# Patient Record
Sex: Male | Born: 1990 | Race: Black or African American | Hispanic: No | Marital: Single | Smoking: Current every day smoker
Health system: Southern US, Community
[De-identification: ages and names within clinical notes are randomized; demographics above are authoritative.]

## PROBLEM LIST (undated history)

## (undated) DIAGNOSIS — I10 Essential (primary) hypertension: Secondary | ICD-10-CM

---

## 2015-07-29 ENCOUNTER — Emergency Department (HOSPITAL_BASED_OUTPATIENT_CLINIC_OR_DEPARTMENT_OTHER)
Admission: EM | Admit: 2015-07-29 | Discharge: 2015-07-29 | Disposition: A | Discharge status: Home / Self Care | Payer: Self-pay | Attending: Emergency Medicine | Admitting: Emergency Medicine

## 2015-07-29 ENCOUNTER — Encounter (HOSPITAL_BASED_OUTPATIENT_CLINIC_OR_DEPARTMENT_OTHER): Payer: Self-pay

## 2015-07-29 DIAGNOSIS — J069 Acute upper respiratory infection, unspecified: Secondary | ICD-10-CM | POA: Insufficient documentation

## 2015-07-29 DIAGNOSIS — F17200 Nicotine dependence, unspecified, uncomplicated: Secondary | ICD-10-CM | POA: Insufficient documentation

## 2015-07-29 DIAGNOSIS — I10 Essential (primary) hypertension: Secondary | ICD-10-CM | POA: Insufficient documentation

## 2015-07-29 HISTORY — DX: Essential (primary) hypertension: I10

## 2015-07-29 NOTE — ED Notes (Signed)
Pt not in lobby.  

## 2015-07-29 NOTE — ED Notes (Signed)
Pt not in waiting room

## 2015-07-29 NOTE — ED Notes (Signed)
Pt c/o headache, nasal congestion and cough for the last two days after moving in the rain, unrelieved by one dose of zyrtec

## 2017-03-09 ENCOUNTER — Emergency Department (HOSPITAL_COMMUNITY)
Admission: EM | Admit: 2017-03-09 | Discharge: 2017-03-09 | Disposition: A | Discharge status: Home / Self Care | Payer: Self-pay | Attending: Emergency Medicine | Admitting: Emergency Medicine

## 2017-03-09 ENCOUNTER — Encounter (HOSPITAL_COMMUNITY): Payer: Self-pay | Admitting: *Deleted

## 2017-03-09 ENCOUNTER — Other Ambulatory Visit: Payer: Self-pay

## 2017-03-09 ENCOUNTER — Emergency Department (HOSPITAL_COMMUNITY): Payer: Self-pay

## 2017-03-09 DIAGNOSIS — W208XXA Other cause of strike by thrown, projected or falling object, initial encounter: Secondary | ICD-10-CM | POA: Insufficient documentation

## 2017-03-09 DIAGNOSIS — M79671 Pain in right foot: Secondary | ICD-10-CM | POA: Insufficient documentation

## 2017-03-09 DIAGNOSIS — Y939 Activity, unspecified: Secondary | ICD-10-CM | POA: Insufficient documentation

## 2017-03-09 DIAGNOSIS — Y990 Civilian activity done for income or pay: Secondary | ICD-10-CM | POA: Insufficient documentation

## 2017-03-09 DIAGNOSIS — F17200 Nicotine dependence, unspecified, uncomplicated: Secondary | ICD-10-CM | POA: Insufficient documentation

## 2017-03-09 DIAGNOSIS — I10 Essential (primary) hypertension: Secondary | ICD-10-CM | POA: Insufficient documentation

## 2017-03-09 DIAGNOSIS — Y929 Unspecified place or not applicable: Secondary | ICD-10-CM | POA: Insufficient documentation

## 2017-03-09 MED ORDER — IBUPROFEN 400 MG PO TABS: 400.0000 mg | ORAL_TABLET | Freq: Three times a day (TID) | ORAL | 0 refills | Status: AC

## 2017-03-09 MED ORDER — IBUPROFEN 400 MG PO TABS
600.0000 mg | ORAL_TABLET | Freq: Once | ORAL | Status: AC
  Administered 2017-03-09: 600 mg via ORAL
  Filled 2017-03-09: qty 2

## 2017-03-09 NOTE — ED Triage Notes (Signed)
Pt was at work when furniture fell on right foot,

## 2017-03-09 NOTE — ED Provider Notes (Signed)
[name] EMERGENCY DEPARTMENT Provider Note   CSN: [id number] Arrival date & time: 03/09/17  1939     History   Chief Complaint Chief Complaint  Patient presents with  . Foot Pain    HPI Robert Mendez is a 26 y.o. male.  HPI Patient presents with concern of right foot and ankle pain.  Patient was at work earlier in the day, when a piece of furniture fell onto his leg, and he was trapped beneath it for several minutes. No other substantial injuries, no other pain or complaints elsewhere. He states that he is generally well, and was in his usual state of health prior to this. Initially there was some discomfort, but this increased over the course of the day, not substantially improved with Tylenol.  No distal loss of sensation, no strength.  Past Medical History:  Diagnosis Date  . Hypertension     There are no active problems to display for this patient.   History reviewed. No pertinent surgical history.     Home Medications    Prior to Admission medications   Not on File    Family History No family history on file.  Social History Social History   Tobacco Use  . Smoking status: Light Tobacco Smoker  . Smokeless tobacco: Never Used  Substance Use Topics  . Alcohol use: Yes  . Drug use: Not on file     Allergies   Patient has no known allergies.   Review of Systems Review of Systems  Constitutional: Negative for fever.  Respiratory: Negative for shortness of breath.   Cardiovascular: Negative for chest pain.  Musculoskeletal:       Negative aside from HPI  Skin:       Negative aside from HPI  Allergic/Immunologic: Negative for immunocompromised state.  Neurological: Negative for weakness.     Physical Exam Updated Vital Signs BP (!) 136/91   Pulse 90   Temp 98.1 F (36.7 C) (Oral)   Resp 20   Ht 6\' 1"  (1.854 m)   Wt 66.7 kg (147 lb)   SpO2 96%   BMI 19.39 kg/m   Physical Exam  Constitutional: He is oriented to person,  place, and time. He appears well-developed. No distress.  HENT:  Head: Normocephalic and atraumatic.  Cardiovascular: Normal rate, regular rhythm and intact distal pulses.  Pulmonary/Chest: Effort normal. No respiratory distress.  Abdominal: He exhibits no distension.  Musculoskeletal: He exhibits no edema.       Right knee: Normal.       Legs: Neurological: He is alert and oriented to person, place, and time.  Skin: Skin is warm and dry.  Psychiatric: He has a normal mood and affect.  Nursing note and vitals reviewed.    ED Treatments / Results   Radiology I reviewed the x-ray, agree with the interpretation.   Procedures Procedures (including critical care time)  Medications Ordered in ED Medications  ibuprofen (ADVIL,MOTRIN) tablet 600 mg (not administered)     Initial Impression / Assessment and Plan / ED Course  I have reviewed the triage vital signs and the nursing notes.  Pertinent labs & imaging results that were available during my care of the patient were reviewed by me and considered in my medical decision making (see chart for details).  On repeat exam patient is in no distress. X-ray does not demonstrate fracture, and the patient is distally neurovascularly intact. Patient likely suffered a bruise, with superficial abrasion. Absent other concerning findings, the patient was  discharged in stable condition with home care instructions, follow-up instructions.   Final Clinical Impressions(s) / ED Diagnoses  Foot pain, initial encounter, right   [name], [name], MD 03/09/17 2031

## 2017-03-09 NOTE — ED Notes (Addendum)
Pt wheeled to waiting room. Pt verbalized understanding of discharge instructions.   

## 2017-03-09 NOTE — Discharge Instructions (Signed)
As discussed, your evaluation today has been largely reassuring.  But, it is important that you monitor your condition carefully, and do not hesitate to return to the ED if you develop new, or concerning changes in your condition. ? ?Otherwise, please follow-up with your physician for appropriate ongoing care. ? ?

## 2018-03-27 ENCOUNTER — Other Ambulatory Visit: Payer: Self-pay

## 2018-03-27 ENCOUNTER — Emergency Department (HOSPITAL_COMMUNITY)
Admission: EM | Admit: 2018-03-27 | Discharge: 2018-03-27 | Disposition: A | Discharge status: Home / Self Care | Payer: Self-pay | Attending: Emergency Medicine | Admitting: Emergency Medicine

## 2018-03-27 ENCOUNTER — Encounter (HOSPITAL_COMMUNITY): Payer: Self-pay | Admitting: *Deleted

## 2018-03-27 ENCOUNTER — Emergency Department (HOSPITAL_COMMUNITY): Payer: Self-pay

## 2018-03-27 DIAGNOSIS — F17200 Nicotine dependence, unspecified, uncomplicated: Secondary | ICD-10-CM | POA: Insufficient documentation

## 2018-03-27 DIAGNOSIS — I10 Essential (primary) hypertension: Secondary | ICD-10-CM | POA: Insufficient documentation

## 2018-03-27 DIAGNOSIS — J181 Lobar pneumonia, unspecified organism: Secondary | ICD-10-CM

## 2018-03-27 DIAGNOSIS — J189 Pneumonia, unspecified organism: Secondary | ICD-10-CM | POA: Insufficient documentation

## 2018-03-27 MED ORDER — IBUPROFEN 200 MG PO TABS
600.0000 mg | ORAL_TABLET | Freq: Once | ORAL | Status: AC
  Administered 2018-03-27: 600 mg via ORAL
  Filled 2018-03-27: qty 3

## 2018-03-27 MED ORDER — DOXYCYCLINE HYCLATE 100 MG PO CAPS: 100.0000 mg | ORAL_CAPSULE | Freq: Two times a day (BID) | ORAL | 0 refills | Status: AC

## 2018-03-27 MED ORDER — BENZONATATE 100 MG PO CAPS: 100.0000 mg | ORAL_CAPSULE | Freq: Three times a day (TID) | ORAL | 0 refills | Status: AC

## 2018-03-27 MED ORDER — ALBUTEROL SULFATE (2.5 MG/3ML) 0.083% IN NEBU
5.0000 mg | INHALATION_SOLUTION | Freq: Once | RESPIRATORY_TRACT | Status: AC
  Administered 2018-03-27: 5 mg via RESPIRATORY_TRACT
  Filled 2018-03-27: qty 6

## 2018-03-27 NOTE — Discharge Instructions (Signed)
Take antibiotics as directed, you can use Tylenol, ibuprofen and prescribed cough medicine to help manage symptoms, make sure you are drinking plenty of fluids.  Follow-up with your primary care doctor if symptoms are not improving.  Return to the emergency department if you have worsening chest pain, shortness of breath, fevers, cough or any other new or concerning symptoms.

## 2018-03-27 NOTE — ED Triage Notes (Signed)
URI symptoms since last Wednesday with cough productive yellowish with blood tinged sputum. Head congeston

## 2018-03-27 NOTE — ED Provider Notes (Signed)
[facility]-EMERGENCY DEPT Provider Note   CSN: [id number] Arrival date & time: 03/27/18  [id number]     History   Chief Complaint Chief Complaint  Patient presents with  . URI    HPI Jiles ProwsMontayne Panning is a 27 y.o. male.  Richarda OverlieMontayne Duke SalviaRandolph is a 27 y.o. male with a history of hypertension, right emergency department for evaluation of 5 days of, rhinorrhea and congestion.  Since onset symptoms have been persistent and worsening.  Patient reports cough productive of mucus.  He also reports some sinus pressure and discomfort.  He has had chills but no fevers at home.  Reports his chest feels a little bit tight denies any chest pain or shortness of breath.  He does report he is a current smoker, but denies any history of reactive airway disease.  No abdominal pain, nausea or vomiting.  Patient's 4111-month-old son is here with similar symptoms and was recently diagnosed with rhino and adenovirus.  No other known contacts.  He has been using Tylenol Cold and flu without improvement in his symptoms have not tried anything else.     Past Medical History:  Diagnosis Date  . Hypertension     There are no active problems to display for this patient.   History reviewed. No pertinent surgical history.      Home Medications    Prior to Admission medications   Medication Sig Start Date End Date Taking? Authorizing Provider  acetaminophen (TYLENOL) 500 MG tablet Take 1,000 mg every 6 (six) hours as needed by mouth for mild pain, moderate pain or headache.    [provider]  benzonatate (TESSALON) 100 MG capsule Take 1 capsule (100 mg total) by mouth every 8 (eight) hours. 03/27/18   Dartha LodgeFord, Kelsey N, PA-C  doxycycline (VIBRAMYCIN) 100 MG capsule Take 1 capsule (100 mg total) by mouth 2 (two) times daily. One po bid x 7 days 03/27/18   Dartha LodgeFord, Kelsey N, PA-C    Family History No family history on file.  Social History Social History   Tobacco Use  . Smoking status:  Light Tobacco Smoker  . Smokeless tobacco: Never Used  Substance Use Topics  . Alcohol use: Yes  . Drug use: Never     Allergies   Patient has no known allergies.   Review of Systems Review of Systems  Constitutional: Positive for chills. Negative for fever.  HENT: Positive for congestion, rhinorrhea and sore throat.   Eyes: Negative for pain, discharge and redness.  Respiratory: Positive for cough and chest tightness. Negative for shortness of breath and wheezing.   Cardiovascular: Negative for chest pain.  Gastrointestinal: Negative for abdominal pain, nausea and vomiting.  Genitourinary: Negative for dysuria and frequency.  Musculoskeletal: Negative for arthralgias and myalgias.  Skin: Negative for color change and rash.  Neurological: Negative for dizziness, syncope and light-headedness.     Physical Exam Updated Vital Signs BP 123/89 (BP Location: Right Arm)   Pulse 99   Temp 100 F (37.8 C) (Oral)   Resp 16   Ht 6\' 1"  (1.854 m)   Wt 60.8 kg   SpO2 100%   BMI 17.68 kg/m   Physical Exam  Constitutional: He is oriented to person, place, and time. He appears well-developed and well-nourished. He does not appear ill. No distress.  HENT:  Head: Normocephalic and atraumatic.  Mouth/Throat: Oropharynx is clear and moist.  TMs clear with good landmarks, moderate nasal mucosa edema with clear rhinorrhea, posterior oropharynx clear and moist, with  some erythema, no edema or exudates, uvula midline  Eyes: Right eye exhibits no discharge. Left eye exhibits no discharge.  Neck: Neck supple.  No rigidity  Cardiovascular: Normal rate, regular rhythm, normal heart sounds and intact distal pulses. Exam reveals no gallop and no friction rub.  No murmur heard. Pulmonary/Chest: Effort normal and breath sounds normal. No respiratory distress.  Respirations equal and unlabored, patient able to speak in full sentences, lungs clear to auscultation bilaterally aside from a few  scattered wheezes, occasional cough during exam  Abdominal: Soft. Bowel sounds are normal. He exhibits no distension and no mass. There is no tenderness. There is no guarding.  Abdomen soft, nondistended, nontender to palpation in all quadrants without guarding or peritoneal signs  Musculoskeletal: He exhibits no deformity.  Lymphadenopathy:    He has no cervical adenopathy.  Neurological: He is alert and oriented to person, place, and time.  Skin: Skin is warm and dry. Capillary refill takes less than 2 seconds. He is not diaphoretic.  Psychiatric: He has a normal mood and affect. His behavior is normal.  Nursing note and vitals reviewed.    ED Treatments / Results  Labs (all labs ordered are listed, but only abnormal results are displayed) Labs Reviewed - No data to display  EKG None  Radiology Dg Chest 2 View  Result Date: 03/27/2018 CLINICAL DATA:  Productive cough. EXAM: CHEST - 2 VIEW COMPARISON:  None. FINDINGS: The heart size and mediastinal contours are within normal limits. No pneumothorax or pleural effusion is noted. Right lung is clear. Minimal left basilar atelectasis or infiltrate is noted. The visualized skeletal structures are unremarkable. IMPRESSION: Minimal left basilar atelectasis or infiltrate is noted. Electronically Signed   By: Lupita Raider, M.D.   On: 03/27/2018 10:10    Procedures Procedures (including critical care time)  Medications Ordered in ED Medications  ibuprofen (ADVIL,MOTRIN) tablet 600 mg (600 mg Oral Given 03/27/18 0941)  albuterol (PROVENTIL) (2.5 MG/3ML) 0.083% nebulizer solution 5 mg (5 mg Nebulization Given 03/27/18 0958)     Initial Impression / Assessment and Plan / ED Course  I have reviewed the triage vital signs and the nursing notes.  Pertinent labs & imaging results that were available during my care of the patient were reviewed by me and considered in my medical decision making (see chart for details).  Patient has been  diagnosed with CAP via chest xray. Pt is not ill appearing, immunocompromised, and does not have multiple co morbidities, therefore I feel like the they can be treated as an OP with abx therapy. Discussed symptomatic treatment as well, prescription for tessalon Perles provided. Pt has been advised to return to the ED if symptoms worsen or they do not improve. Pt verbalizes understanding and is agreeable with plan.   Final Clinical Impressions(s) / ED Diagnoses   Final diagnoses:  Community acquired pneumonia of left lower lobe of lung [facility])    ED Discharge Orders         Ordered    doxycycline (VIBRAMYCIN) 100 MG capsule  2 times daily     03/27/18 1055    benzonatate (TESSALON) 100 MG capsule  Every 8 hours     03/27/18 1055           Jodi Geralds Howard, [place] 03/29/18 0003    Cathren Laine, MD 03/31/18 [phone]

## 2019-08-07 IMAGING — CR DG CHEST 2V
2 series · 2 of 2 positions shown · non-contrast
Comparison: None.

CLINICAL DATA: Productive cough.

EXAM:
CHEST - 2 VIEW

[w chest pa]
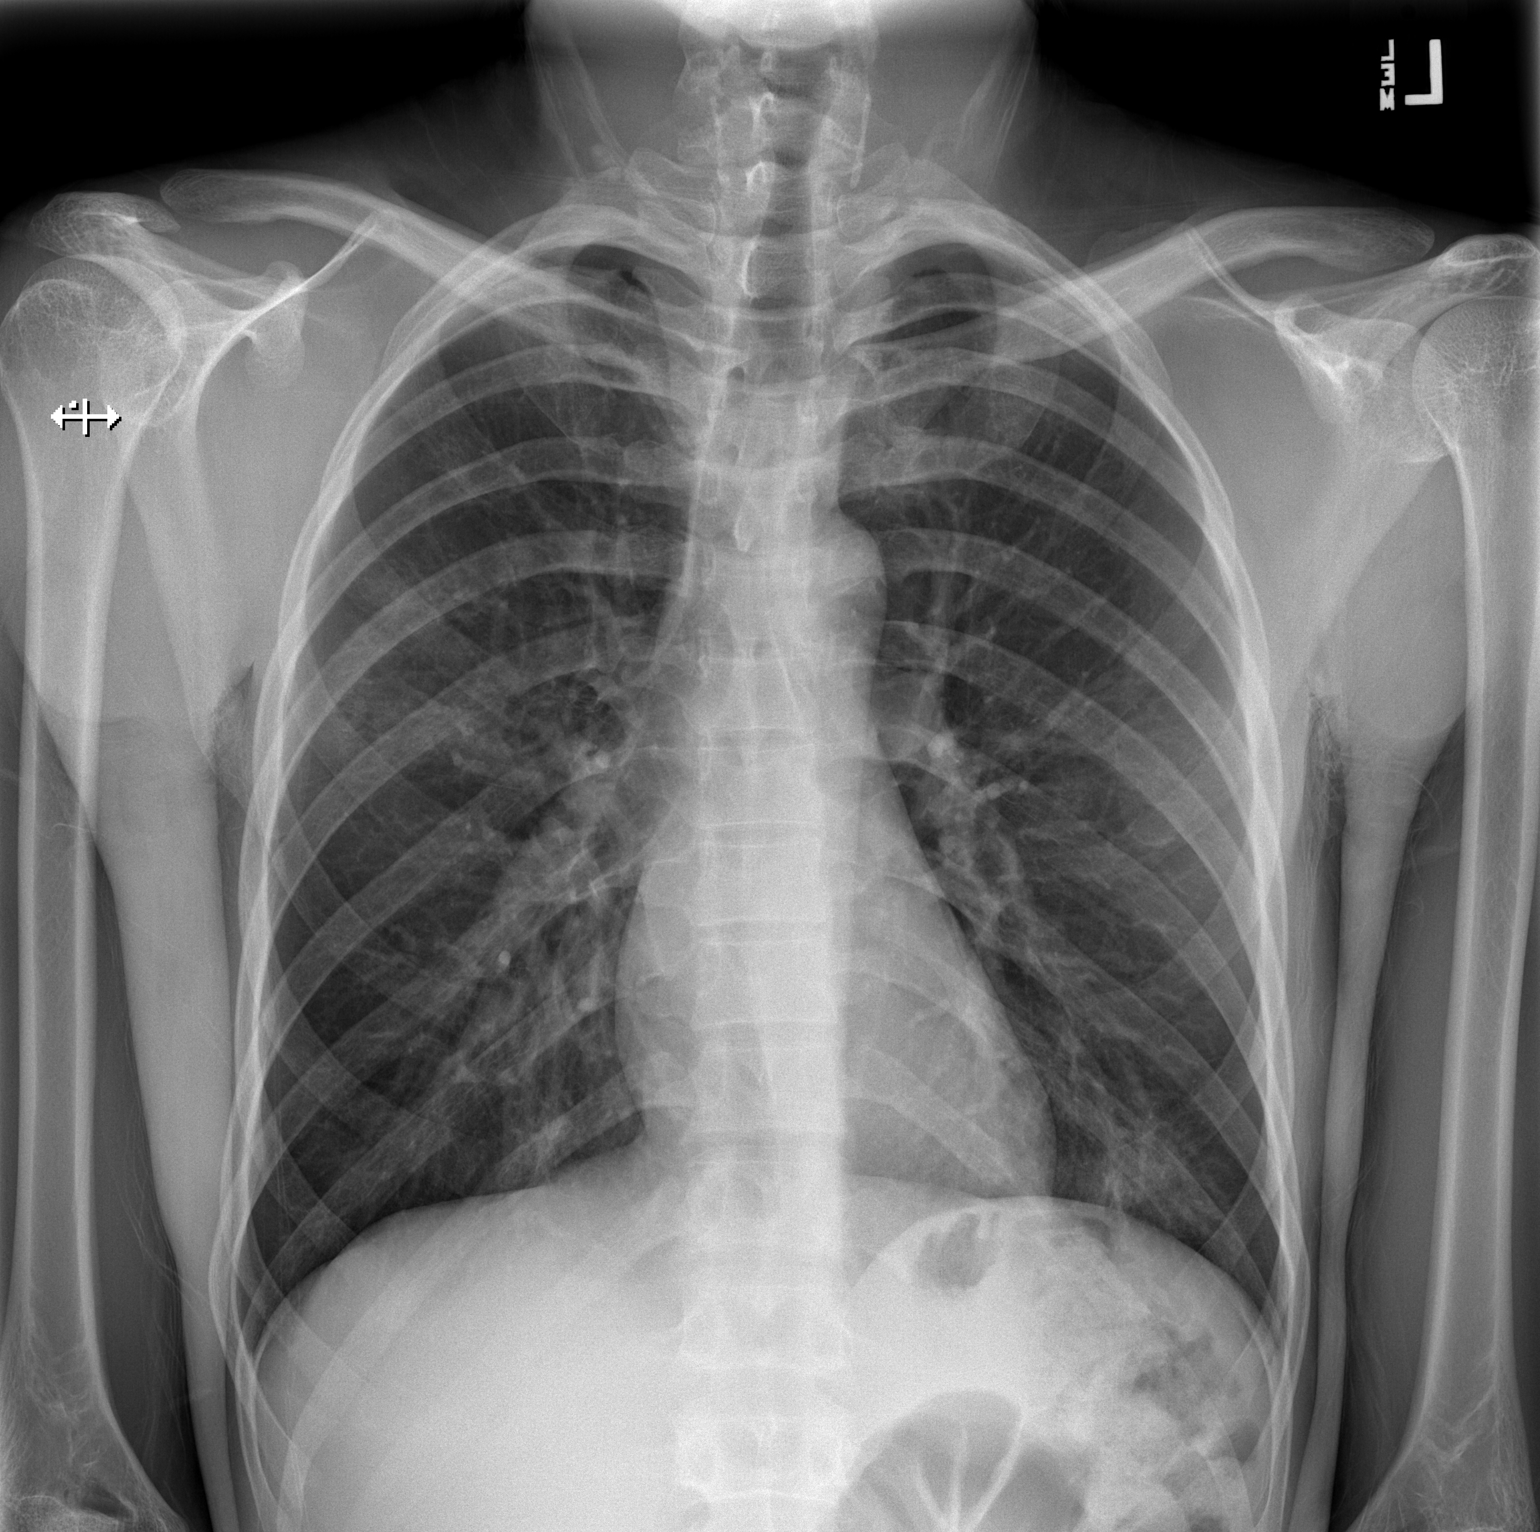

[w chest lat]
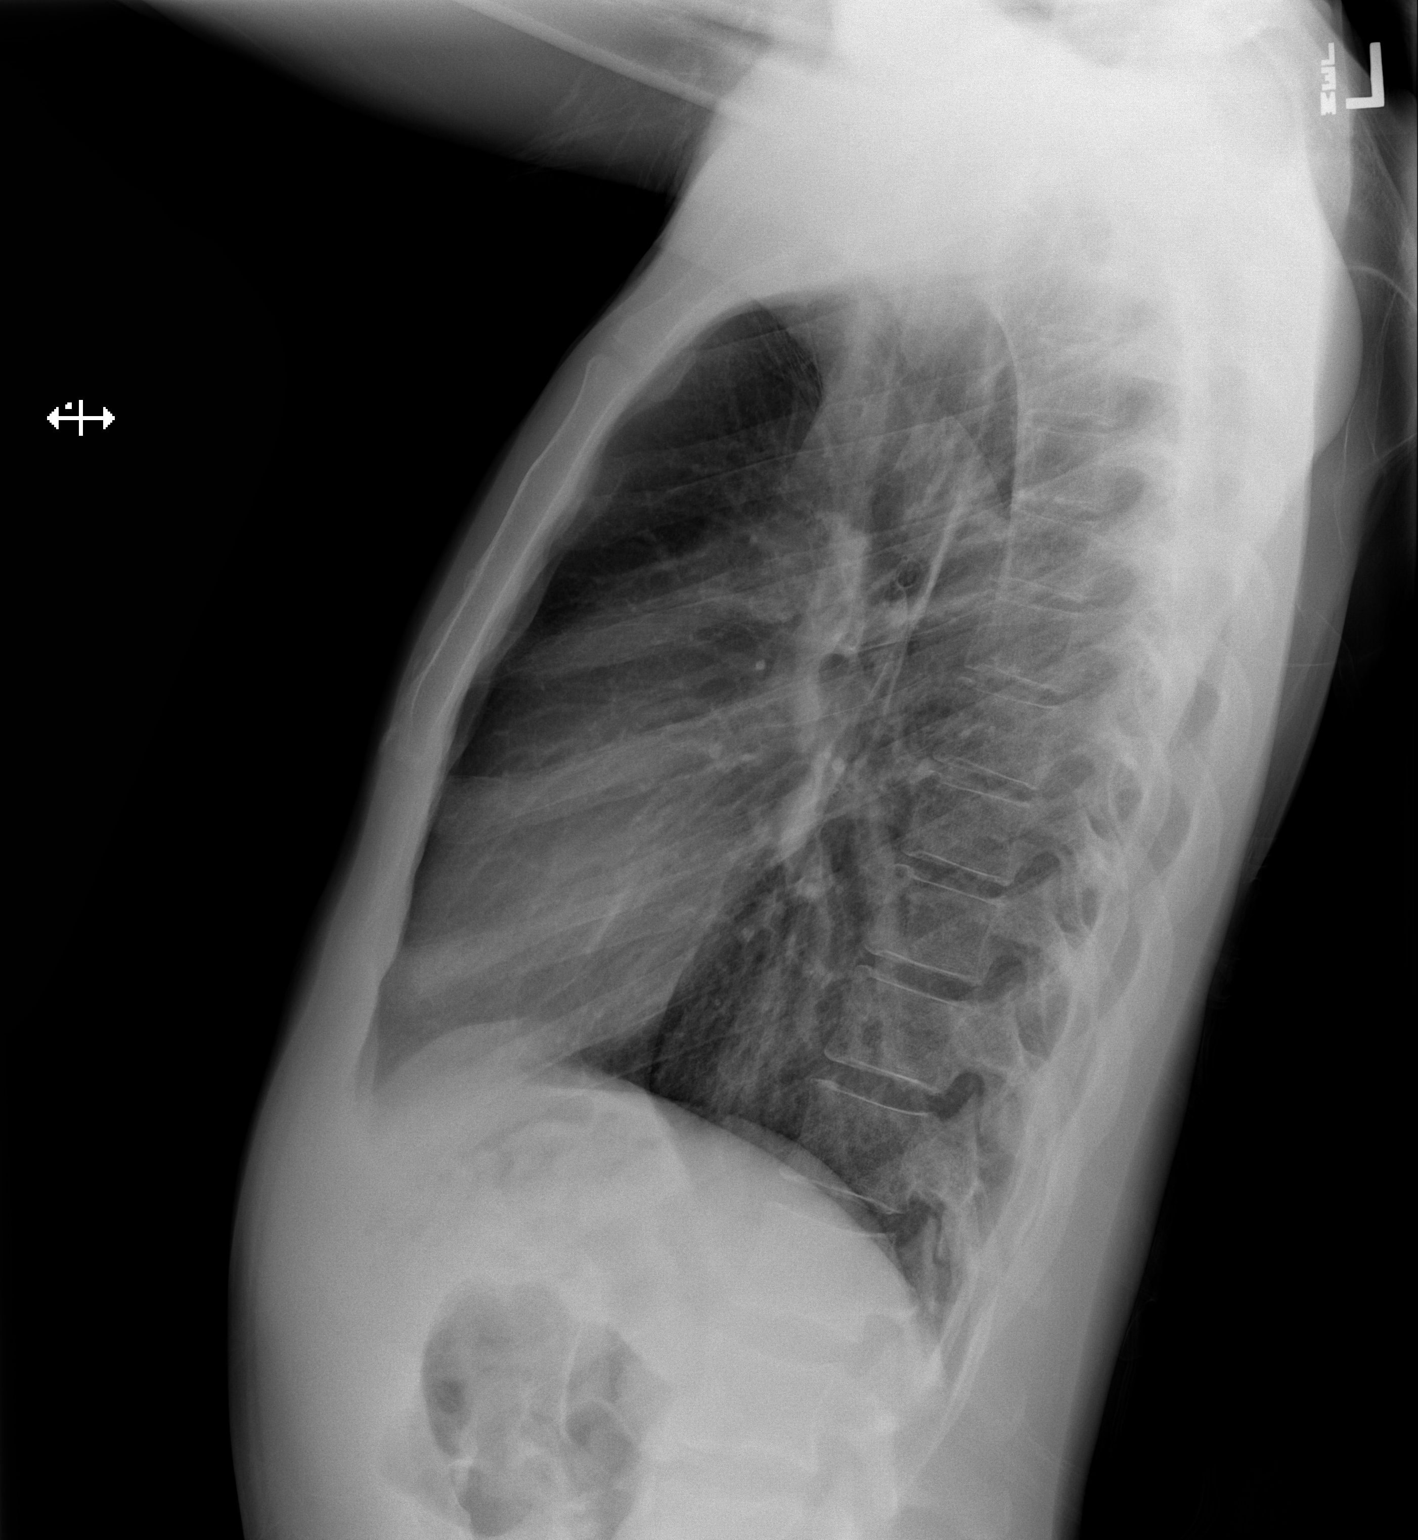

[2 of 2 positions shown; findings below may reference images not displayed]

FINDINGS: The heart size and mediastinal contours are within normal limits. No
pneumothorax or pleural effusion is noted. Right lung is clear.
Minimal left basilar atelectasis or infiltrate is noted. The
visualized skeletal structures are unremarkable.
IMPRESSION: Minimal left basilar atelectasis or infiltrate is noted.

## 2023-09-13 ENCOUNTER — Emergency Department (HOSPITAL_COMMUNITY)
Admission: EM | Admit: 2023-09-13 | Discharge: 2023-09-13 | Disposition: A | Discharge status: Home / Self Care | Payer: Self-pay | Attending: Emergency Medicine | Admitting: Emergency Medicine

## 2023-09-13 ENCOUNTER — Other Ambulatory Visit: Payer: Self-pay

## 2023-09-13 ENCOUNTER — Encounter (HOSPITAL_COMMUNITY): Payer: Self-pay

## 2023-09-13 ENCOUNTER — Emergency Department (HOSPITAL_COMMUNITY): Payer: Self-pay

## 2023-09-13 DIAGNOSIS — S60221A Contusion of right hand, initial encounter: Secondary | ICD-10-CM

## 2023-09-13 DIAGNOSIS — S62025A Nondisplaced fracture of middle third of navicular [scaphoid] bone of left wrist, initial encounter for closed fracture: Secondary | ICD-10-CM

## 2023-09-13 DIAGNOSIS — W1789XA Other fall from one level to another, initial encounter: Secondary | ICD-10-CM | POA: Insufficient documentation

## 2023-09-13 MED ORDER — IBUPROFEN 600 MG PO TABS: 600.0000 mg | ORAL_TABLET | Freq: Three times a day (TID) | ORAL | 0 refills | Status: AC

## 2023-09-13 MED ORDER — HYDROCODONE-ACETAMINOPHEN 5-325 MG PO TABS: 1.0000 | ORAL_TABLET | Freq: Four times a day (QID) | ORAL | 0 refills | Status: DC | PRN

## 2023-09-13 MED ORDER — IBUPROFEN 800 MG PO TABS
800.0000 mg | ORAL_TABLET | Freq: Once | ORAL | Status: AC
Start: 2023-09-13 — End: 2023-09-13
  Administered 2023-09-13: 800 mg via ORAL
  Filled 2023-09-13: qty 1

## 2023-09-13 NOTE — Discharge Instructions (Signed)
 Do not remove the splint from your left hand injury until you are evaluated by the hand specialist.  You may remove your right Velcro wrist splint as needed.  You have been prescribed some pain medication, take the ibuprofen  as prescribed, adding the hydrocodone if needed for extra pain relief.  Hydrocodone will make you drowsy, do not drive within 4 hours of taking this medication.  Ice and elevate is much as possible as this will help with pain and swelling as well.  Call Dr. Ernesta Heading today to get an his appointment list to be seen as soon as possible.
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
## 2023-09-15 ENCOUNTER — Encounter: Payer: Self-pay | Admitting: Orthopedic Surgery

## 2023-09-15 ENCOUNTER — Ambulatory Visit (INDEPENDENT_AMBULATORY_CARE_PROVIDER_SITE_OTHER): Payer: Self-pay | Admitting: Orthopedic Surgery

## 2023-09-15 VITALS — BP 137/87 | HR 91 | Ht 73.0 in | Wt 136.0 lb

## 2023-09-15 DIAGNOSIS — S62025A Nondisplaced fracture of middle third of navicular [scaphoid] bone of left wrist, initial encounter for closed fracture: Secondary | ICD-10-CM

## 2023-09-15 MED ORDER — OXYCODONE HCL 5 MG PO TABS: 5.0000 mg | ORAL_TABLET | ORAL | 0 refills | Status: AC | PRN

## 2023-09-15 NOTE — Patient Instructions (Signed)
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
## 2023-10-02 ENCOUNTER — Other Ambulatory Visit (INDEPENDENT_AMBULATORY_CARE_PROVIDER_SITE_OTHER): Payer: Self-pay

## 2023-10-02 ENCOUNTER — Ambulatory Visit (INDEPENDENT_AMBULATORY_CARE_PROVIDER_SITE_OTHER): Payer: Self-pay | Admitting: Orthopedic Surgery

## 2023-10-02 ENCOUNTER — Encounter: Payer: Self-pay | Admitting: Orthopedic Surgery

## 2023-10-02 DIAGNOSIS — S62025A Nondisplaced fracture of middle third of navicular [scaphoid] bone of left wrist, initial encounter for closed fracture: Secondary | ICD-10-CM

## 2023-10-02 NOTE — Progress Notes (Signed)
 Fracture care follow-up  Mr. Lobdell was intermittently placed in my clinic  I read Dr. Paz Bott note he has a scaphoid fracture he is planning cast treatment with thumb spica  Today he was to get a cast off x-ray  He complains of continued pain left wrist Chief Complaint  Patient presents with   Wrist Injury    Left     Encounter Diagnosis  Name Primary?   Closed nondisplaced fracture of middle third of scaphoid bone of left wrist, initial encounter Yes    DOI/DOS/ Date: 09/13/23  Improved  Dr Ernesta Heading plan is .  F/u 2 weeks, cast off and repeat XR   DG Wrist Navic Only Left Result Date: 10/02/2023 X-ray isolated navicular Nondisplaced fracture of the scaphoid No change in position no angulation Scaphoid fracture nondisplaced   DG Wrist Complete Left Result Date: 10/02/2023 Left wrist out of plaster previous history of scaphoid fracture x-ray 2 weeks later compared to prior film continued nondisplaced fracture scaphoid see scaphoid view for further details    New thumb spica cast placed  Message sent to Dr.CAIRNS  Return in 2 weeks or sooner if marked deems necessary

## 2023-10-02 NOTE — Progress Notes (Signed)
   There were no vitals taken for this visit.  There is no height or weight on file to calculate BMI.  No chief complaint on file.   No diagnosis found.  DOI/DOS/ Date: 09/13/23  Improved     Dr Ernesta Heading plan is .  F/u 2 weeks, cast off and repeat XR

## 2023-10-17 ENCOUNTER — Encounter: Payer: Self-pay | Admitting: Orthopedic Surgery

## 2023-11-07 ENCOUNTER — Ambulatory Visit (INDEPENDENT_AMBULATORY_CARE_PROVIDER_SITE_OTHER): Payer: Self-pay | Admitting: Orthopedic Surgery

## 2023-11-07 ENCOUNTER — Other Ambulatory Visit: Payer: Self-pay

## 2023-11-07 DIAGNOSIS — S62025A Nondisplaced fracture of middle third of navicular [scaphoid] bone of left wrist, initial encounter for closed fracture: Secondary | ICD-10-CM

## 2023-11-07 NOTE — Patient Instructions (Addendum)
 Wear the brace provided for the next 2 weeks.  Okay to remove for hygiene and to initiate range of motion.  You should be working on achieving full range of motion within the next couple weeks.  In 2 weeks she can transition out of the brace.  Focus on range of motion before transitioning to strengthening.  Follow-up in 1 month.   Hand Exercises  Hand exercises can be helpful for almost anyone. These exercises can strengthen the hands, improve flexibility and movement, and increase blood flow to the hands. These results can make work and daily tasks easier. Hand exercises can be especially helpful for people who have joint pain from arthritis or have nerve damage from overuse (carpal tunnel syndrome). These exercises can also help people who have injured a hand.  Exercises Most of these hand exercises are gentle stretching and motion exercises. It is usually safe to do them often throughout the day. Warming up your hands before exercise may help to reduce stiffness. You can do this with gentle massage or by placing your hands in warm water for 10-15 minutes. It is normal to feel some stretching, pulling, tightness, or mild discomfort as you begin new exercises. This will gradually improve. Stop an exercise right away if you feel sudden, severe pain or your pain gets worse. Ask your health care provider which exercises are best for you. Knuckle bend or claw fist Stand or sit with your arm, hand, and all five fingers pointed straight up. Make sure to keep your wrist straight during the exercise. Gently bend your fingers down toward your palm until the tips of your fingers are touching the top of your palm. Keep your big knuckle straight and just bend the small knuckles in your fingers. Hold this position for 10 seconds. Straighten (extend) your fingers back to the starting position. Repeat this exercise 5-10 times with each hand. Full finger fist Stand or sit with your arm, hand, and all five  fingers pointed straight up. Make sure to keep your wrist straight during the exercise. Gently bend your fingers into your palm until the tips of your fingers are touching the middle of your palm. Hold this position for 10 seconds. Extend your fingers back to the starting position, stretching every joint fully. Repeat this exercise 5-10 times with each hand. Straight fist Stand or sit with your arm, hand, and all five fingers pointed straight up. Make sure to keep your wrist straight during the exercise. Gently bend your fingers at the big knuckle, where your fingers meet your hand, and the middle knuckle. Keep the knuckle at the tips of your fingers straight and try to touch the bottom of your palm. Hold this position for 10 seconds. Extend your fingers back to the starting position, stretching every joint fully. Repeat this exercise 5-10 times with each hand. Tabletop Stand or sit with your arm, hand, and all five fingers pointed straight up. Make sure to keep your wrist straight during the exercise. Gently bend your fingers at the big knuckle, where your fingers meet your hand, as far down as you can while keeping the small knuckles in your fingers straight. Think of forming a tabletop with your fingers. Hold this position for 10 seconds. Extend your fingers back to the starting position, stretching every joint fully. Repeat this exercise 5-10 times with each hand. Finger spread Place your hand flat on a table with your palm facing down. Make sure your wrist stays straight as you do this exercise.  Spread your fingers and thumb apart from each other as far as you can until you feel a gentle stretch. Hold this position for 10 seconds. Bring your fingers and thumb tight together again. Hold this position for 10 seconds. Repeat this exercise 5-10 times with each hand. Making circles Stand or sit with your arm, hand, and all five fingers pointed straight up. Make sure to keep your wrist straight  during the exercise. Make a circle by touching the tip of your thumb to the tip of your index finger. Hold for 10 seconds. Then open your hand wide. Repeat this motion with your thumb and each finger on your hand. Repeat this exercise 5-10 times with each hand. Thumb motion Sit with your forearm resting on a table and your wrist straight. Your thumb should be facing up toward the ceiling. Keep your fingers relaxed as you move your thumb. Lift your thumb up as high as you can toward the ceiling. Hold for 10 seconds. Bend your thumb across your palm as far as you can, reaching the tip of your thumb for the small finger (pinkie) side of your palm. Hold for 10 seconds. Repeat this exercise 5-10 times with each hand. Grip strengthening Hold a stress ball or other soft ball in the middle of your hand. Slowly increase the pressure, squeezing the ball as much as you can without causing pain. Think of bringing the tips of your fingers into the middle of your palm. All of your finger joints should bend when doing this exercise. Hold your squeeze for 10 seconds, then relax. Repeat this exercise 5-10 times with each hand.   Contact a health care provider if: Your hand pain or discomfort gets much worse when you do an exercise. Your hand pain or discomfort does not improve within 2 hours after you exercise. If you have any of these problems, stop doing these exercises right away. Do not do them again unless your health care provider says that you can.    Get help right away if: You develop sudden, severe hand pain or swelling. If this happens, stop doing these exercises right away. Do not do them again unless your health care provider says that you can. This information is not intended to replace advice given to you by your health care provider. Make sure you discuss any questions you have with your health care provider.   Wrist Fracture Rehab Ask your health care provider which exercises are safe  for you. Do exercises exactly as told by your health care provider and adjust them as directed. It is normal to feel mild stretching, pulling, tightness, or discomfort as you do these exercises. Stop right away if you feel sudden pain or your pain gets worse. Do not begin these exercises until told by your health care provider. Stretching and range-of-motion exercises These exercises warm up your muscles and joints and improve the movement and flexibility of your wrist and hand. These exercises also help to relieve pain,numbness, and tingling. Finger flexion and extension Sit or stand with your elbow at your side. Open and stretch your left / right fingers as wide as you can (extension). Hold this position for 10 seconds. Close your left / right fingers into a gentle fist (flexion). Hold this position for 10 seconds. Slowly return to the starting position. Repeat 10 times. Complete this exercise 1-2 times a day. Wrist flexion Bend your left / right elbow to a 90-degree angle (right angle) with your palm facing the floor.  Bend your wrist forward so your fingers point toward the floor (flexion). Hold this position for 10 seconds. Slowly return to the starting position. Repeat 10 times. Complete this exercise 1-2 times a day. Wrist extension Bend your left / right elbow to a 90-degree angle (right angle) with your palm facing the floor. Bend your wrist backward so your fingers point toward the ceiling (extension). Hold this position for 10 seconds. Slowly return to the starting position. Repeat 10 times. Complete this exercise 1-2 times a day. Ulnar deviation Bend your left / right elbow to a 90-degree angle (right angle), and rest your forearm on a table with your palm facing down. Keeping your hand flat on the table, bend your left / right wrist toward your small finger (pinkie). This is ulnar deviation. Hold this position for 10 seconds. Slowly return to the starting position. Repeat 10  times. Complete this exercise 1-2 times a day. Radial deviation Bend your left / right elbow to a 90-degree angle (right angle), and rest your forearm on a table with your palm facing down. Keeping your hand flat on the table, bend your left / right wrist toward your thumb. This is radial deviation. Hold this position for 10 seconds. Slowly return to the starting position. Repeat 10 times. Complete this exercise 1-2 times a day. Forearm rotation, supination Stand or sit with your left / right elbow bent to a 90-degree angle (right angle) at your side. Position your forearm so that the thumb is facing the ceiling (neutral position). Turn (rotate) your palm up toward the ceiling (supination), stopping when you feel a gentle stretch. Hold this position for 10 seconds. Slowly return to the starting position. Repeat 10 times. Complete this exercise 1-2 times a day. Forearm rotation, pronation Stand or sit with your left / right elbow bent to a 90-degree angle (right angle) at your side. Position your forearm so that the thumb is facing the ceiling (neutral position). Turn (rotate) your palm down toward the floor (pronation), stopping when you feel a gentle stretch. Hold this position for 10 seconds. Slowly return to the starting position. Repeat 10 times. Complete this exercise 1-2 times a day. Wrist flexion stretch  Extend your left / right arm in front of you and turn your palm down toward the floor. If told by your health care provider, bend your left / right arm to a 90-degree angle (right angle) at your side. Using your uninjured hand, gently press over the back of your left / right hand to bend your wrist and fingers toward the floor (flexion). Go as far as you can to feel a stretch without causing pain. Hold this position for 10 seconds. Slowly return to the starting position. Repeat 10 times. Complete this exercise 1-2 times a day. Wrist extension stretch  Extend your left / right arm  in front of you and turn your palm up toward the ceiling. If told by your health care provider, bend your left / right arm to a 90-degree angle (right angle) at your side. Using your uninjured hand, gently press over the palm of your left / right hand to bend your wrist and fingers toward the floor (extension). Go as far as you can to feel a stretch without causing pain. Hold this position for 10 seconds. Slowly return to the starting position. Repeat 10 times. Complete this exercise 1-2 times a day. Forearm rotation stretch, supination Stand or sit with your arms at your sides. Bend your left / right  elbow to a 90-degree angle (right angle). Using your uninjured hand, turn your left / right palm up toward the ceiling (assisted supination) until you feel a gentle stretch in the inside of your forearm. Hold this position for 10 seconds. Slowly return to the starting position. Repeat 10 times. Complete this exercise 1-2 times a day. Forearm rotation stretch, pronation Stand or sit with your arms at your sides. Bend your left / right elbow to a 90-degree angle (right angle). Using your uninjured hand, turn your left / right palm down toward the floor (assisted pronation) until you feel a gentle stretch in the top of your forearm. Hold this position for 10 seconds. Slowly return to the starting position. Repeat 10 times. Complete this exercise 1-2 times a day. Strengthening exercises These exercises build strength and endurance in your wrist and hand. Enduranceis the ability to use your muscles for a long time, even after they get tired. Wrist flexion Sit with your left / right forearm supported on a table. Your elbow should be at waist height. Rest your hand over the edge of the table, palm up. Gently grasp a 5 lb / kg weight (can of soup). Or, hold an exercise band or tube in both hands, keeping your hands at the same level and hip distance apart. There should be slight tension in the exercise  band or tube. Without moving your forearm or elbow, slowly bend your wrist up toward the ceiling (wrist flexion). Hold this position for 10 seconds. Slowly return to the starting position. Repeat 10 times. Complete this exercise 1-2 times a day. Wrist extension Sit with your left / right forearm supported on a table. Your elbow should be at waist height. Rest your hand over the edge of the table, palm down. Gently grasp a 5 lb / kg weight. Or, hold an exercise band or tube in both hands, keeping your hands at the same level and hip distance apart. There should be slight tension in the exercise band or tube. Without moving your forearm or elbow, slowly curl your hand up toward the ceiling (extension). Hold this position for 10 seconds. Slowly return to the starting position. Repeat 10 times. Complete this exercise 1-2 times a day. Forearm rotation, supination  Sit with your left / right forearm supported on a table. Your elbow should be at waist height. Rest your hand over the edge of the table, palm down. Gently grasp a lightweight hammer near the head. As this exercise gets easier for you, try holding the hammer farther down the handle. Without moving your elbow, slowly turn (rotate) your palm up toward the ceiling (supination). Hold this position for 10 seconds. Slowly return to the starting position. Repeat 10 times. Complete this exercise 1-2 times a day. Forearm rotation, pronation  Sit with your left / right forearm supported on a table. Your elbow should be at waist height. Rest your hand over the edge of the table, palm up. Gently grasp a lightweight hammer near the head. As this exercise gets easier for you, try holding the hammer farther down the handle. Without moving your elbow, slowly turn (rotate) your palm down toward the floor (pronation). Hold this position for 10 seconds. Slowly return to the starting position. Repeat 10 times. Complete this exercise 1-2 times a  day. Grip strengthening  Hold one of these items in your left / right hand: a dense sponge, a stress ball, or a large, rolled sock. Slowly squeeze the object as hard as you  can without increasing any pain. Hold your squeeze for 10 seconds. Slowly release your grip. Repeat 10 times. Complete this exercise 1-2 times a day. This information is not intended to replace advice given to you by your health care provider. Make sure you discuss any questions you have with your healthcare provider. Document Revised: 08/22/2019 Document Reviewed: 08/22/2019 Elsevier Patient Education  2022 [employer].

## 2023-11-07 NOTE — Progress Notes (Unsigned)
 Return patient Visit  Assessment: Robert Vullo Jr. is a 33 y.o. male with the following: 1. Closed nondisplaced fracture of middle third of scaphoid bone of left wrist, subsequent encounter  Plan: [name] fell on an outstretched hand, and sustained a scaphoid waist fracture of the left wrist.  This was treated without surgery.  He has been immobilized for the past 6 weeks.  Radiographs remain stable.  Transition to a thumb spica brace.  Can initiate range of motion.  Okay to come out of the brace starting in 2 weeks.  Follow-up: Return in about 4 weeks (around 12/05/2023).  Subjective:  Chief Complaint  Patient presents with   Left wrist pain    Scaphoid fracture, treated without surgery    History of Present Illness: Robert Marcou Jr. is a 33 y.o. male who returns for evaluation of left wrist pain.  He has been immobilized in a cast.  His last follow-up was with Dr. Margrette.  He has done well.  He is not having any pain currently.  No numbness or tingling.  Review of Systems: No fevers or chills No numbness or tingling No chest pain No shortness of breath No bowel or bladder dysfunction No GI distress No headaches    Objective: There were no vitals taken for this visit.  Physical Exam:  General: Alert and oriented. and No acute distress. Gait: Normal gait.  Left wrist without swelling.  No bruising.  Active motion intact throughout the left hand.  Some stiffness.  Not quite able to make a full fist.  Minimal tenderness within the snuffbox.   IMAGING: I personally reviewed images previously obtained from the ED  X-rays of the left wrist and scaphoid were obtained in clinic today.  No interval displacement of a scaphoid fracture.  Fracture line is not readily visible.  There has been interval consolidation.  No change in overall alignment.  No bony lesions.  Impression: Healed left scaphoid fracture   New Medications:  No orders of the  defined types were placed in this encounter.     Oneil DELENA Horde, MD  11/08/2023 8:33 AM

## 2023-11-08 ENCOUNTER — Encounter: Payer: Self-pay | Admitting: Orthopedic Surgery

## 2023-12-05 ENCOUNTER — Encounter: Payer: Self-pay | Admitting: Orthopedic Surgery
